# Patient Record
Sex: Female | Born: 1965 | Race: Black or African American | Hispanic: No | Marital: Single | Smoking: Never smoker
Health system: Southern US, Community
[De-identification: ages and names within clinical notes are randomized; demographics above are authoritative.]

## PROBLEM LIST (undated history)

## (undated) HISTORY — PX: ABDOMINAL HYSTERECTOMY: SHX81

---

## 1998-08-06 ENCOUNTER — Encounter: Admission: RE | Admit: 1998-08-06 | Discharge: 1998-08-06 | Payer: Self-pay | Admitting: Hematology and Oncology

## 2008-05-16 ENCOUNTER — Ambulatory Visit: Payer: Self-pay | Admitting: Oncology

## 2008-06-03 LAB — CBC WITH DIFFERENTIAL/PLATELET
BASO%: 0.2 % (ref 0.0–2.0)
EOS%: 1.7 % (ref 0.0–7.0)
HCT: 30.3 % — ABNORMAL LOW (ref 34.8–46.6)
MCH: 23 pg — ABNORMAL LOW (ref 26.0–34.0)
MCHC: 31.3 g/dL — ABNORMAL LOW (ref 32.0–36.0)
MONO#: 0.5 10*3/uL (ref 0.1–0.9)
NEUT%: 41.6 % (ref 39.6–76.8)
RBC: 4.11 10*6/uL (ref 3.70–5.32)
RDW: 27.8 % — ABNORMAL HIGH (ref 11.3–14.5)
WBC: 3.8 10*3/uL — ABNORMAL LOW (ref 3.9–10.0)
lymph#: 1.7 10*3/uL (ref 0.9–3.3)

## 2008-06-05 LAB — COMPREHENSIVE METABOLIC PANEL
ALT: <8 U/L (ref 0–35)
AST: 13 U/L (ref 0–37)
CO2: 24 mEq/L (ref 19–32)
Calcium: 9.1 mg/dL (ref 8.4–10.5)
Chloride: 103 mEq/L (ref 96–112)
Creatinine, Ser: 0.69 mg/dL (ref 0.40–1.20)
Sodium: 137 mEq/L (ref 135–145)
Total Protein: 7.5 g/dL (ref 6.0–8.3)

## 2008-06-05 LAB — FERRITIN: Ferritin: 8 ng/mL — ABNORMAL LOW (ref 10–291)

## 2008-06-05 LAB — HEMOGLOBINOPATHY EVALUATION
Hgb A2 Quant: 1.7 % — ABNORMAL LOW (ref 2.2–3.2)
Hgb F Quant: 0 % (ref 0.0–2.0)
Hgb S Quant: 0 % (ref 0.0–0.0)

## 2008-06-05 LAB — IRON AND TIBC
%SAT: 6 % — ABNORMAL LOW (ref 20–55)
TIBC: 380 ug/dL (ref 250–470)

## 2008-09-19 ENCOUNTER — Ambulatory Visit: Payer: Self-pay | Admitting: Oncology

## 2009-02-24 ENCOUNTER — Emergency Department (HOSPITAL_COMMUNITY): Admission: EM | Admit: 2009-02-24 | Discharge: 2009-02-24 | Payer: Self-pay | Admitting: Emergency Medicine

## 2010-07-16 ENCOUNTER — Encounter
Admission: RE | Admit: 2010-07-16 | Discharge: 2010-07-16 | Payer: Self-pay | Source: Home / Self Care | Attending: Obstetrics and Gynecology | Admitting: Obstetrics and Gynecology

## 2010-07-18 ENCOUNTER — Encounter: Payer: Self-pay | Admitting: Obstetrics and Gynecology

## 2010-08-04 ENCOUNTER — Other Ambulatory Visit: Payer: Self-pay | Admitting: Obstetrics and Gynecology

## 2010-08-04 DIAGNOSIS — Z1231 Encounter for screening mammogram for malignant neoplasm of breast: Secondary | ICD-10-CM

## 2010-08-18 ENCOUNTER — Ambulatory Visit
Admission: RE | Admit: 2010-08-18 | Discharge: 2010-08-18 | Disposition: A | Discharge status: Home / Self Care | Payer: BC Managed Care – PPO | Source: Ambulatory Visit | Attending: Obstetrics and Gynecology | Admitting: Obstetrics and Gynecology

## 2010-08-18 DIAGNOSIS — Z1231 Encounter for screening mammogram for malignant neoplasm of breast: Secondary | ICD-10-CM

## 2010-09-01 ENCOUNTER — Encounter (HOSPITAL_COMMUNITY): Payer: BC Managed Care – PPO | Attending: Obstetrics and Gynecology

## 2010-09-01 DIAGNOSIS — D649 Anemia, unspecified: Secondary | ICD-10-CM | POA: Insufficient documentation

## 2010-09-03 ENCOUNTER — Encounter (HOSPITAL_COMMUNITY)
Admission: RE | Admit: 2010-09-03 | Discharge: 2010-09-03 | Disposition: A | Discharge status: Home / Self Care | Payer: BC Managed Care – PPO | Source: Ambulatory Visit | Attending: Obstetrics and Gynecology | Admitting: Obstetrics and Gynecology

## 2010-09-03 ENCOUNTER — Other Ambulatory Visit (HOSPITAL_COMMUNITY): Payer: BC Managed Care – PPO

## 2010-09-03 LAB — CBC
Hemoglobin: 9.6 g/dL — ABNORMAL LOW (ref 12.0–15.0)
RBC: 3.45 MIL/uL — ABNORMAL LOW (ref 3.87–5.11)
WBC: 4.4 10*3/uL (ref 4.0–10.5)

## 2010-09-10 ENCOUNTER — Other Ambulatory Visit: Payer: Self-pay | Admitting: Obstetrics and Gynecology

## 2010-09-10 ENCOUNTER — Inpatient Hospital Stay (HOSPITAL_COMMUNITY)
Admission: RE | Admit: 2010-09-10 | Discharge: 2010-09-12 | DRG: 359 | Disposition: A | Discharge status: Home / Self Care | Payer: BC Managed Care – PPO | Source: Ambulatory Visit | Attending: Obstetrics and Gynecology | Admitting: Obstetrics and Gynecology

## 2010-09-10 DIAGNOSIS — N920 Excessive and frequent menstruation with regular cycle: Principal | ICD-10-CM | POA: Diagnosis present

## 2010-09-10 DIAGNOSIS — Z01812 Encounter for preprocedural laboratory examination: Secondary | ICD-10-CM

## 2010-09-10 DIAGNOSIS — D509 Iron deficiency anemia, unspecified: Secondary | ICD-10-CM | POA: Diagnosis present

## 2010-09-10 DIAGNOSIS — Z01818 Encounter for other preprocedural examination: Secondary | ICD-10-CM

## 2010-09-10 DIAGNOSIS — D259 Leiomyoma of uterus, unspecified: Secondary | ICD-10-CM | POA: Diagnosis present

## 2010-09-10 LAB — ABO/RH: ABO/RH(D): O POS

## 2010-09-11 LAB — BASIC METABOLIC PANEL
CO2: 31 mEq/L (ref 19–32)
Calcium: 8.9 mg/dL (ref 8.4–10.5)
Chloride: 102 mEq/L (ref 96–112)
Glucose, Bld: 132 mg/dL — ABNORMAL HIGH (ref 70–99)
Sodium: 136 mEq/L (ref 135–145)

## 2010-09-11 LAB — CBC
HCT: 31.5 % — ABNORMAL LOW (ref 36.0–46.0)
Hemoglobin: 9.8 g/dL — ABNORMAL LOW (ref 12.0–15.0)
MCH: 28.3 pg (ref 26.0–34.0)
MCHC: 31.1 g/dL (ref 30.0–36.0)
RBC: 3.46 MIL/uL — ABNORMAL LOW (ref 3.87–5.11)

## 2010-09-15 ENCOUNTER — Other Ambulatory Visit (HOSPITAL_COMMUNITY): Payer: BC Managed Care – PPO

## 2010-09-22 NOTE — Discharge Summary (Signed)
  [name], [place]              ACCOUNT NO.:  [id number]  MEDICAL RECORD NO.:  [id number]           PATIENT TYPE:  I  LOCATION:  9318                          [facility]ILITY:  [facility]  PHYSICIAN:  Maxie Better, M.D.DATE OF BIRTH:  1966-04-20  DATE OF ADMISSION:  09/10/2010 DATE OF DISCHARGE:  09/12/2010                              DISCHARGE SUMMARY   ADMISSION DIAGNOSES:  Menorrhagia, uterine fibroids, iron-deficiency anemia.  DISCHARGE DIAGNOSES:  Menorrhagia, uterine fibroids, iron-deficiency anemia.  PROCEDURES:  Exploratory laparotomy, total abdominal hysterectomy.  HISTORY OF PRESENT ILLNESS:  This is a 45 year old G 0, single black female with symptomatic uterine fibroids who opted for definitive surgical management.  HOSPITAL COURSE:  The patient was admitted to [facility].  She was taken to the operating room where she underwent an exploratory lap, TAH. The findings were that of a fibroid uterus which weighed in the operating room 1310 grams, normal ovaries bilaterally, normal tubes, normal upper abdomen.  Appendix was not seen.  This patient had a unremarkable postoperative course.  Her CBC on postop day #1, showed a hemoglobin of 9.8, hematocrit of 31.5, white count of 15.8, platelet count of 197,000.  Her incision had no erythema, induration, or exudate. The patient had minimal need for pain.  She subsequently passed flatus on postop day #2.  She was tolerating a regular diet from postop day #1, and at that point she was deemed well to be discharged home.  DISPOSITION:  Home.  CONDITION:  Stable.  DISCHARGE MEDICATIONS:  Integra one p.o. daily, Tylox 1-2 every 3-4 hours p.r.n. pain.  Motrin 800 mg one p.o. q.6 h p.r.n. pain.  Followup appointment at [facility] on Tuesday or Wednesday for staple removal and 4-6 weeks postop.  Discharge instructions was reviewed with the patient include no heavy lifting for 2 weeks.  No driving for 2 weeks, nothing  per vagina for 4-6 weeks.  Call for severe abdominal pain, nausea, vomiting, increased incisional pain, drainage or redness from the incision site.  No straining with the bowel movements, no tampon, douches, or  intercourse, call if soaking a regular pad every hour more frequently.     Maxie Better, M.D.     [place]/MEDQ  D:  09/12/2010  T:  09/13/2010  Job:  [id number]  Electronically Signed by Nena Jordan Hadyn Blanck M.D. on 09/22/2010 05:09:34 AM

## 2010-09-22 NOTE — Op Note (Signed)
[name], [name]              ACCOUNT NO.:  [id number]  MEDICAL RECORD NO.:  [id number]           PATIENT TYPE:  I  LOCATION:  9318                          [facility]  [facility]  PHYSICIAN:  Maxie Better, M.D.DATE OF BIRTH:  December 15, 1965  DATE OF PROCEDURE:  09/10/2010 DATE OF DISCHARGE:                              OPERATIVE REPORT   PREOPERATIVE DIAGNOSES:  Menorrhagia, iron-deficiency anemia, uterine fibroids.  POSTOPERATIVE DIAGNOSES:  Menorrhagia, iron-deficiency anemia, uterine fibroids.  PROCEDURE:  Exploratory laparotomy, total abdominal hysterectomy.  ANESTHESIA:  General.  SURGEON:  Maxie Better, MD  ASSISTANT:  Lenoard Aden, MD  PROCEDURE:  Under adequate general anesthesia, the patient was placed in a supine position.  She was sterilely prepped and draped in the usual fashion.  Indwelling Foley catheter was sterilely placed.  The patient received IV antibiotics for prophylaxis.  Marcaine 0.25% was injected along the planned [name] skin incision site.  [name] skin incision was then made carried down to the rectus fascia.  Rectus fascia was opened transversely.  Rectus fascia was then bluntly and sharply dissected off the rectus muscle in superior inferior fashion.  The rectus muscle was split in the midline.  The parietal peritoneum was entered sharply and extended.  The upper abdomen was then explored. Normal liver edge was palpable, normal palpable kidney, fibroid uterus extending to be equal about  16 weeks' uterus, irregular in shape was noted.  The uterus was exteriorized, shortened bilateral utero ovarian ligaments was noted, small ovaries were noted bilaterally.  Normal tubes were noted bilaterally.  The procedure was started by bilaterally suture ligating the round ligaments and severing at the midline and severing it from its attachment.  The vesicouterine peritoneum was then opened transversely and the bladder was then sharply  dissected off the lower uterine segment.  The posterior leaf of the broad ligaments were bilaterally opened.  Due to the shortened utero-ovarian ligaments, the LigaSure was then used to serially clamped, cauterized, and then ultimately cut with subsequent removal of the tubes and ovary from the uterine attachment.  Once that was none, the uterine vessels were bilaterally skeletonized, doubly clamped, cut and suture ligated with 0 Vicryl x2.  The cardinal ligament was then bilaterally clamped, cut, and suture ligated as well.  Subsequently the fundus was truncated from its cervical attachment to facilitate the surgery.  The cervical stump was then grasped.  The cardinal ligaments were then continued to be serially bilaterally clamped, cut, and on the left there was a pumping vessel noticed, thought to be secondary to the retraction of a vessel in one of the pedicles and after grasping that area and suture ligating again with 0 Vicryl, hemostasis was then achieved.  The procedure was then continued down to the cervicovaginal junction was reached bilaterally at that point, the cervicovaginal junction was then bilaterally clamped and the cervix was severed from its vaginal attachment.  The vaginal cuff was then closed with were bilaterally clamped over running lock stitch running stitch for midline to the fascia angle and then oversewn with a running lock stitch to the lateral aspect on both sides and the middle piece  was then closed with interrupted 0 Vicryl sutures.  Bleeding in the posterior aspect of the vaginal cuff was oversewn with 3-0 Vicryl suture so that the areas of bleeding on the cuff resulted in 0 Vicryl mattress stitches being placed with good hemostasis noted.  The ovarian pedicles were noted. Inspecting the one on the left, particularly resulted and the need for 3-0 Vicryl suture placement, despite the fact that the LigaSure had been used to be recauterized that area.  There  was no pedicle length left to suspend the ovaries to the round ligament in either side.  The ureters were noted to be deep in the pelvis.  The abdomen was copiously irrigated and suctioned debris and good hemostasis was noted.  The packings noted in the abdomen was removed.  The appendix was not seen.  The parietal peritoneum was closed with 2-0 Vicryl.  The rectus fascia was closed with 0 Vicryl x2.  The subcutaneous areas irrigated, small bleeders cauterized and interrupted 2-0 plain sutures were placed.  The skin was approximated with Ethicon staples.  Specimen was uterus and cervix measured 1310 grams sent to Pathology.  ESTIMATED BLOOD LOSS:  150 mL.  INTRAOPERATIVE FLUID:  2200 mL.  URINE OUTPUT:  700 mL clear yellow urine.  Sponge and instrument counts x2 was correct.  Complications none.  The patient tolerated procedure well and was transferred to recovery room in stable condition.     Maxie Better, M.D.     [place]/MEDQ  D:  09/10/2010  T:  09/11/2010  Job:  [id number]  Electronically Signed by Nena Jordan Letonia Stead M.D. on 09/22/2010 05:12:28 AM

## 2010-10-02 LAB — URINALYSIS, ROUTINE W REFLEX MICROSCOPIC
Glucose, UA: NEGATIVE mg/dL
Hgb urine dipstick: NEGATIVE
Protein, ur: NEGATIVE mg/dL
pH: 7 (ref 5.0–8.0)

## 2010-10-02 LAB — URINE MICROSCOPIC-ADD ON

## 2010-11-13 NOTE — H&P (Signed)
  [name], [name]              ACCOUNT NO.:  [id number]  MEDICAL RECORD NO.:  [id number]           PATIENT TYPE:  I  LOCATION:  9318                          [facility]  [facility]  PHYSICIAN:  Maxie Better, M.D.DATE OF BIRTH:  05/07/1966  DATE OF ADMISSION:  09/10/2010 DATE OF DISCHARGE:  09/12/2010                             HISTORY & PHYSICAL   HISTORY OF PRESENT ILLNESS:  This is a 45 year old gravida 0, single black female with symptomatic uterine fibroids here for removal of her fibroids.  The patient had been complaining of prolonged vaginal bleeding.  She was evaluated by ultrasound and endometrial biopsies. MRI showed multiple uterine fibroids.  The patient desired to preserve her fertility.  The patient subsequently decided to proceed with a total abdominal hysterectomy after discussing with her family.  ALLERGIES:  No known drug allergies.  MEDICATIONS:  Valtrex, vitamin B12, iron supplementation, and once a day vitamin.  PAST MEDICAL HISTORY: 1. Pernicious anemia. 2. Herpes. 3. Iron-deficiency anemia. 4. Excessive micturition. 5. Fibroid uterus.  FAMILY HISTORY:  Diabetes in mother, hypertension in mother.  Negative for breast, colon, and ovarian cancer.  SOCIAL HISTORY:  The patient is a nonsmoker, single.  No caffeine use, seat-belt use.  REVIEW OF SYSTEMS:  Negative except for the history of present illness.  PHYSICAL EXAMINATION:  GENERAL:  Well-developed, well-nourished black female in no acute distress. SKIN:  No lesions. VITAL SIGNS:  Blood pressure 110/68, weight 195 pounds, and height 5 feet 6-1/2 inches. LUNGS:  Clear to auscultation. HEART:  Regular rate and rhythm without murmur. BREASTS: Soft and nontender.  No palpable mass. ABDOMEN:  Normal.  No palpable hernias.  Abdominal mass with a periumbilical mass consistent with fibroid uterus.  Liver and spleen was normal. PELVIC:  No lesions.  Vagina had some bloody discharge.  Cervix  was normal.  The uterus was 18 weeks irregular, anteverted.  Adnexa nonpalpable. EXTREMITIES:  No edema. LYMPH NODES:  No palpable axillary, supraclavicular, or inguinal nodes palpable.  IMPRESSION: 1. Fibroid uterus. 2. Excessive micturition. 3. Anemia.  PLAN:  Exploratory laparotomy and total abdominal hysterectomy.  Risk of the procedure was reviewed with the patient including but not limited to infection, bleeding, possible blood transfusion, preservation of her ovaries, internal scar tissue, bowel obstruction in the future, fistula formation, postop care, and criteria for discharge reviewed.     Maxie Better, M.D.     [place]/MEDQ  D:  11/08/2010  T:  11/08/2010  Job:  [id number]  Electronically Signed by Nena Jordan Diego Ulbricht M.D. on 11/13/2010 06:42:42 PM

## 2011-09-21 ENCOUNTER — Other Ambulatory Visit: Payer: Self-pay | Admitting: Obstetrics and Gynecology

## 2011-09-21 DIAGNOSIS — Z1231 Encounter for screening mammogram for malignant neoplasm of breast: Secondary | ICD-10-CM

## 2011-10-04 ENCOUNTER — Ambulatory Visit
Admission: RE | Admit: 2011-10-04 | Discharge: 2011-10-04 | Disposition: A | Discharge status: Home / Self Care | Payer: BC Managed Care – PPO | Source: Ambulatory Visit | Attending: Obstetrics and Gynecology | Admitting: Obstetrics and Gynecology

## 2011-10-04 DIAGNOSIS — Z1231 Encounter for screening mammogram for malignant neoplasm of breast: Secondary | ICD-10-CM

## 2012-09-13 ENCOUNTER — Other Ambulatory Visit: Payer: Self-pay

## 2012-09-13 DIAGNOSIS — Z1231 Encounter for screening mammogram for malignant neoplasm of breast: Secondary | ICD-10-CM

## 2012-10-11 ENCOUNTER — Ambulatory Visit
Admission: RE | Admit: 2012-10-11 | Discharge: 2012-10-11 | Disposition: A | Discharge status: Home / Self Care | Payer: BC Managed Care – PPO | Source: Ambulatory Visit

## 2012-10-11 DIAGNOSIS — Z1231 Encounter for screening mammogram for malignant neoplasm of breast: Secondary | ICD-10-CM

## 2013-10-01 ENCOUNTER — Other Ambulatory Visit: Payer: Self-pay

## 2013-10-01 DIAGNOSIS — Z1231 Encounter for screening mammogram for malignant neoplasm of breast: Secondary | ICD-10-CM

## 2013-10-15 ENCOUNTER — Ambulatory Visit
Admission: RE | Admit: 2013-10-15 | Discharge: 2013-10-15 | Disposition: A | Discharge status: Home / Self Care | Payer: Self-pay | Source: Ambulatory Visit

## 2013-10-15 ENCOUNTER — Encounter (INDEPENDENT_AMBULATORY_CARE_PROVIDER_SITE_OTHER): Payer: Self-pay

## 2013-10-15 DIAGNOSIS — Z1231 Encounter for screening mammogram for malignant neoplasm of breast: Secondary | ICD-10-CM

## 2014-02-27 ENCOUNTER — Emergency Department (HOSPITAL_COMMUNITY)
Admission: EM | Admit: 2014-02-27 | Discharge: 2014-02-28 | Disposition: A | Discharge status: Home / Self Care | Payer: BC Managed Care – PPO | Attending: Emergency Medicine | Admitting: Emergency Medicine

## 2014-02-27 ENCOUNTER — Encounter (HOSPITAL_COMMUNITY): Payer: Self-pay | Admitting: Emergency Medicine

## 2014-02-27 DIAGNOSIS — W268XXA Contact with other sharp object(s), not elsewhere classified, initial encounter: Secondary | ICD-10-CM | POA: Insufficient documentation

## 2014-02-27 DIAGNOSIS — IMO0002 Reserved for concepts with insufficient information to code with codable children: Secondary | ICD-10-CM | POA: Insufficient documentation

## 2014-02-27 DIAGNOSIS — Z862 Personal history of diseases of the blood and blood-forming organs and certain disorders involving the immune mechanism: Secondary | ICD-10-CM | POA: Diagnosis not present

## 2014-02-27 DIAGNOSIS — Y9289 Other specified places as the place of occurrence of the external cause: Secondary | ICD-10-CM | POA: Insufficient documentation

## 2014-02-27 DIAGNOSIS — S68119A Complete traumatic metacarpophalangeal amputation of unspecified finger, initial encounter: Secondary | ICD-10-CM

## 2014-02-27 DIAGNOSIS — Y9389 Activity, other specified: Secondary | ICD-10-CM | POA: Diagnosis not present

## 2014-02-27 DIAGNOSIS — S61209A Unspecified open wound of unspecified finger without damage to nail, initial encounter: Secondary | ICD-10-CM | POA: Diagnosis present

## 2014-02-27 DIAGNOSIS — S68129A Partial traumatic metacarpophalangeal amputation of unspecified finger, initial encounter: Secondary | ICD-10-CM

## 2014-02-27 NOTE — ED Notes (Signed)
Pt was slicing vegetables and sliced her rt middle finger with the vegetable slicer; pt sliced off end of rt middle finger; bleeding upon arrival; clean dressing and pressure applied to area

## 2014-02-27 NOTE — ED Provider Notes (Signed)
CSN: [id number]     Arrival date & time 02/27/14  2220 History   None   This chart was scribed for non-physician practitioner, Hazel Sams, PA, working with Virgel Manifold, MD by Terressa Koyanagi, ED Scribe. This patient was seen in room [place] and the patient's care was started at 11:47 PM.  Chief Complaint  Patient presents with  . Extremity Laceration   The history is provided by the patient. No language interpreter was used.   HPI Comments: [name] is a 48 y.o. female, with a Hx of anemia, who presents to the Emergency Department complaining of a amputation of her right, middle fingertip sustained tonight while slicing cucumbers with a mandoline. Pt also complains of associated pain to said area and describes pain as a throbbing pain and rates it a 5 out of 10. Pt reports she is UTD with her tetanus shot.   History reviewed. No pertinent past medical history. Past Surgical History  Procedure Laterality Date  . Abdominal hysterectomy     No family history on file. History  Substance Use Topics  . Smoking status: Never Smoker   . Smokeless tobacco: Not on file  . Alcohol Use: Yes     Comment: socially   OB History   Grav Para Term Preterm Abortions TAB SAB Ect Mult Living                 Review of Systems  Constitutional: Negative for fever and chills.  Skin: Positive for wound (amputation of tip of right, middle finger ).  Psychiatric/Behavioral: Negative for confusion.  All other systems reviewed and are negative.     Allergies  Review of patient's allergies indicates no known allergies.  Home Medications   Prior to Admission medications   Not on File   Triage Vitals: BP 106/61  Pulse 65  Temp(Src) 98 F (36.7 C) (Oral)  Resp 16  SpO2 99%  LMP 08/08/2010 Physical Exam  Nursing note and vitals reviewed. Constitutional: She is oriented to person, place, and time. She appears well-developed and well-nourished. No distress.  HENT:  Head:  Normocephalic and atraumatic.  Eyes: Conjunctivae and EOM are normal.  Neck: Neck supple. No tracheal deviation present.  Cardiovascular: Normal rate.   Pulmonary/Chest: Effort normal. No respiratory distress.  Musculoskeletal: Normal range of motion.  Small amputation to the very tip of the right third finger. Bleeding controlled at this time. No damage to the nail.  Neurological: She is alert and oriented to person, place, and time.  Skin: Skin is warm and dry.  Psychiatric: She has a normal mood and affect. Her behavior is normal.      ED Course  Procedures   DIAGNOSTIC STUDIES: Oxygen Saturation is 99% on RA, nl by my interpretation.    COORDINATION OF CARE: 11:49 PM-Discussed treatment plan which includes cleaning the wound and dressing the area with pt at bedside. Patient verbalizes understanding and agrees with treatment plan.  11:52 PM: The wound is cleansed, debrided of foreign material as much as possible, and dressed. The patient is alerted to watch for any signs of infection (redness, pus, pain, increased swelling or fever) and call if such occurs. Home wound care instructions are provided. Tetanus up-to-date.   MDM   Final diagnoses:  Fingertip amputation, initial encounter    I personally performed the services described in this documentation, which was scribed in my presence. The recorded information has been reviewed and is accurate.    Martie Lee, PA-C  02/28/14 0011 

## 2014-02-28 NOTE — ED Provider Notes (Signed)
Medical screening examination/treatment/procedure(s) were performed by non-physician practitioner and as supervising physician I was immediately available for consultation/collaboration.   EKG Interpretation None       Virgel Manifold, MD 02/28/14 [phone]

## 2014-02-28 NOTE — Discharge Instructions (Signed)
Keep wound clean and dry. Followup with her primary care provider or return for any changing or worsening symptoms.   Fingertip Injuries and Amputations Fingertip injuries are common and often get injured because they are last to escape when pulling your hand out of harm's way. You have amputated (cut off) part of your finger. How this turns out depends largely on how much was amputated. If just the tip is amputated, often the end of the finger will grow back and the finger may return to much the same as it was before the injury.  If more of the finger is missing, your caregiver has done the best with the tissue remaining to allow you to keep as much finger as is possible. Your caregiver after checking your injury has tried to leave you with a painless fingertip that has durable, feeling skin. If possible, your caregiver has tried to maintain the finger's length and appearance and preserve its fingernail.  Please read the instructions outlined below and refer to this sheet in the next few weeks. These instructions provide you with general information on caring for yourself. Your caregiver may also give you specific instructions. While your treatment has been done according to the most current medical practices available, unavoidable complications occasionally occur. If you have any problems or questions after discharge, please call your caregiver. HOME CARE INSTRUCTIONS   You may resume normal diet and activities as directed or allowed.  Keep your hand elevated above the level of your heart. This helps decrease pain and swelling.  Keep ice packs (or a bag of ice wrapped in a towel) on the injured area for 15-20 minutes, 03-04 times per day, for the first two days.  Change dressings if necessary or as directed.  Clean the wound daily or as directed.  Only take over-the-counter or prescription medicines for pain, discomfort, or fever as directed by your caregiver.  Keep appointments as  directed. SEEK IMMEDIATE MEDICAL CARE IF:  You develop redness, swelling, numbness or increasing pain in the wound.  There is pus coming from the wound.  You develop an unexplained oral temperature above 102 F (38.9 C) or as your caregiver suggests.  There is a foul (bad) smell coming from the wound or dressing.  There is a breaking open of the wound (edges not staying together) after sutures or staples have been removed. MAKE SURE YOU:   Understand these instructions.  Will watch your condition.  Will get help right away if you are not doing well or get worse. Document Released: 05/04/2005 Document Revised: 09/05/2011 Document Reviewed: 04/02/2008 [facility] Patient Information 2015 [place], [place]. This information is not intended to replace advice given to you by your health care provider. Make sure you discuss any questions you have with your health care provider.

## 2014-11-06 ENCOUNTER — Other Ambulatory Visit: Payer: Self-pay

## 2014-11-06 DIAGNOSIS — Z1231 Encounter for screening mammogram for malignant neoplasm of breast: Secondary | ICD-10-CM

## 2014-11-11 ENCOUNTER — Ambulatory Visit
Admission: RE | Admit: 2014-11-11 | Discharge: 2014-11-11 | Disposition: A | Discharge status: Home / Self Care | Payer: 59 | Source: Ambulatory Visit

## 2014-11-11 DIAGNOSIS — Z1231 Encounter for screening mammogram for malignant neoplasm of breast: Secondary | ICD-10-CM

## 2015-12-31 ENCOUNTER — Other Ambulatory Visit: Payer: Self-pay | Admitting: Obstetrics and Gynecology

## 2015-12-31 DIAGNOSIS — Z1231 Encounter for screening mammogram for malignant neoplasm of breast: Secondary | ICD-10-CM

## 2016-01-05 ENCOUNTER — Ambulatory Visit: Payer: 59

## 2016-01-14 ENCOUNTER — Ambulatory Visit
Admission: RE | Admit: 2016-01-14 | Discharge: 2016-01-14 | Disposition: A | Discharge status: Home / Self Care | Payer: 59 | Source: Ambulatory Visit | Attending: Obstetrics and Gynecology | Admitting: Obstetrics and Gynecology

## 2016-01-14 DIAGNOSIS — Z1231 Encounter for screening mammogram for malignant neoplasm of breast: Secondary | ICD-10-CM

## 2017-03-28 ENCOUNTER — Other Ambulatory Visit: Payer: Self-pay | Admitting: Obstetrics and Gynecology

## 2017-03-28 DIAGNOSIS — Z1231 Encounter for screening mammogram for malignant neoplasm of breast: Secondary | ICD-10-CM

## 2017-03-31 ENCOUNTER — Ambulatory Visit: Payer: 59

## 2017-05-15 ENCOUNTER — Other Ambulatory Visit: Payer: Self-pay

## 2017-05-15 ENCOUNTER — Encounter: Payer: Self-pay | Admitting: *Deleted

## 2017-05-15 ENCOUNTER — Emergency Department (INDEPENDENT_AMBULATORY_CARE_PROVIDER_SITE_OTHER)
Admission: EM | Admit: 2017-05-15 | Discharge: 2017-05-15 | Disposition: A | Discharge status: Home / Self Care | Payer: PRIVATE HEALTH INSURANCE | Source: Home / Self Care | Attending: Family Medicine | Admitting: Family Medicine

## 2017-05-15 DIAGNOSIS — R21 Rash and other nonspecific skin eruption: Secondary | ICD-10-CM

## 2017-05-15 LAB — POCT CBC W AUTO DIFF (K'VILLE URGENT CARE)

## 2017-05-15 NOTE — Discharge Instructions (Signed)
Take prednisone 10mg  tabs as follows: 2 tabs twice daily for 4 days, then 2 tabs once daily for 2 days.

## 2017-05-15 NOTE — ED Notes (Signed)
Blood drawn from LT antecubital area. Attempted 1 stick. Pt tolerated procedure well. Charna Archer, LPN

## 2017-05-15 NOTE — ED Triage Notes (Signed)
Pt c/o rash on her legs x 1 day. Denies itching.

## 2017-05-15 NOTE — ED Provider Notes (Signed)
[name] [facility]    CSN: [id number] Arrival date & time: 05/15/17  1851     History   Chief Complaint Chief Complaint  Patient presents with  . Rash    HPI [name] is a 51 y.o. female.   Patient complains of awakening with a rash on her lower legs yesterday.  She denies any contact with allergens, plants, etc.  No recent medication changes or new foods.  She feels well.   The history is provided by the patient.  Rash  Location: lower legs. Quality: dryness, redness and swelling   Quality: not blistering, not bruising, not burning, not draining, not itchy, not painful, not peeling, not scaling and not weeping   Severity:  Mild Onset quality:  Sudden Duration:  1 day Timing:  Constant Progression:  Unchanged Chronicity:  New Context: not animal contact, not chemical exposure, not exposure to similar rash, not food, not hot tub use, not insect bite/sting, not medications, not new detergent/soap, not nuts, not plant contact and not sick contacts   Relieved by:  None tried Worsened by:  Nothing Associated symptoms: no abdominal pain, no diarrhea, no fatigue, no fever, no headaches, no induration, no joint pain, no myalgias, no nausea, no shortness of breath, no sore throat, no URI and not wheezing     History reviewed. No pertinent past medical history.  There are no active problems to display for this patient.   Past Surgical History:  Procedure Laterality Date  . ABDOMINAL HYSTERECTOMY      OB History    No data available       Home Medications    Prior to Admission medications   Not on File    Family History Family History  Problem Relation Age of Onset  . Diabetes Mother   . Hypertension Mother   . Diabetes Brother     Social History Social History   Tobacco Use  . Smoking status: Never Smoker  . Smokeless tobacco: Never Used  Substance Use Topics  . Alcohol use: Yes    Comment: socially  . Drug use: No     Allergies     Patient has no known allergies.   Review of Systems Review of Systems  Constitutional: Negative for fatigue and fever.  HENT: Negative for sore throat.   Respiratory: Negative for shortness of breath and wheezing.   Gastrointestinal: Negative for abdominal pain, diarrhea and nausea.  Musculoskeletal: Negative for arthralgias and myalgias.  Skin: Positive for rash.  Neurological: Negative for headaches.  All other systems reviewed and are negative.    Physical Exam Triage Vital Signs ED Triage Vitals  Enc Vitals Group     BP 05/15/17 1957 138/87     Pulse Rate 05/15/17 1957 70     Resp 05/15/17 1957 16     Temp 05/15/17 1957 98.4 F (36.9 C)     Temp Source 05/15/17 1957 Oral     SpO2 05/15/17 1957 97 %     Weight 05/15/17 1957 200 lb (90.7 kg)     Height 05/15/17 1957 5\' 8"  (1.727 m)     Head Circumference --      Peak Flow --      Pain Score 05/15/17 1958 0     Pain Loc --      Pain Edu? --      Excl. in [place]? --    No data found.  Updated Vital Signs BP 138/87 (BP Location: Left Arm)  Pulse 70   Temp 98.4 F (36.9 C) (Oral)   Resp 16   Ht 5\' 8"  (1.727 m)   Wt 200 lb (90.7 kg)   LMP 08/08/2010   SpO2 97%   BMI 30.41 kg/m   Visual Acuity Right Eye Distance:   Left Eye Distance:   Bilateral Distance:    Right Eye Near:   Left Eye Near:    Bilateral Near:     Physical Exam  Constitutional: She appears well-developed and well-nourished. No distress.  HENT:  Head: Normocephalic.  Right Ear: External ear normal.  Left Ear: External ear normal.  Nose: Nose normal.  Mouth/Throat: Oropharynx is clear and moist.  Eyes: Conjunctivae are normal. Pupils are equal, round, and reactive to light.  Cardiovascular: Normal heart sounds.  Pulmonary/Chest: Breath sounds normal.  Abdominal: There is no tenderness.  Musculoskeletal: She exhibits no edema.       Legs: There are erythematous, slightly tender, irregular, immobile, slightly raised nodules on both  pretibial areas, more prominent on the right.  Lesions are generally 1 to 1.5cm diameter.  Lymphadenopathy:    She has no cervical adenopathy.  Neurological: She is alert.  Skin: Skin is warm and dry. Rash noted.  Nursing note and vitals reviewed.    UC Treatments / Results  Labs (all labs ordered are listed, but only abnormal results are displayed) Labs Reviewed  POCT CBC W AUTO DIFF ([place]):  WBC 8.3; LY 47.4; MO 3.6; GR 49.0; Hgb 12.4; Platelets 190     EKG  EKG Interpretation None       Radiology No results found.  Procedures Procedures (including critical care time)  Medications Ordered in UC Medications - No data to display   Initial Impression / Assessment and Plan / UC Course  I have reviewed the triage vital signs and the nursing notes.  Pertinent labs & imaging results that were available during my care of the patient were reviewed by me and considered in my medical decision making (see chart for details).    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  Will begin prednisone burst/taper. Take prednisone 10mg  tabs as follows (patient has 10mg  tabs at home): 2 tabs twice daily for 4 days, then 2 tabs once daily for 2 days. Followup with dermatologist if not improved two weeks.   Final Clinical Impressions(s) / UC Diagnoses   Final diagnoses:  Rash and nonspecific skin eruption    ED Discharge Orders    None           Kandra Nicolas, MD 05/18/17 1008

## 2017-05-31 ENCOUNTER — Ambulatory Visit
Admission: RE | Admit: 2017-05-31 | Discharge: 2017-05-31 | Disposition: A | Discharge status: Home / Self Care | Payer: PRIVATE HEALTH INSURANCE | Source: Ambulatory Visit | Attending: Obstetrics and Gynecology | Admitting: Obstetrics and Gynecology

## 2017-05-31 DIAGNOSIS — Z1231 Encounter for screening mammogram for malignant neoplasm of breast: Secondary | ICD-10-CM

## 2017-08-03 ENCOUNTER — Encounter: Payer: Self-pay | Admitting: *Deleted

## 2017-08-03 ENCOUNTER — Other Ambulatory Visit: Payer: Self-pay

## 2017-08-03 ENCOUNTER — Emergency Department (INDEPENDENT_AMBULATORY_CARE_PROVIDER_SITE_OTHER)
Admission: EM | Admit: 2017-08-03 | Discharge: 2017-08-03 | Disposition: A | Discharge status: Home / Self Care | Payer: PRIVATE HEALTH INSURANCE | Source: Home / Self Care | Attending: Family Medicine | Admitting: Family Medicine

## 2017-08-03 DIAGNOSIS — B9789 Other viral agents as the cause of diseases classified elsewhere: Secondary | ICD-10-CM

## 2017-08-03 DIAGNOSIS — J069 Acute upper respiratory infection, unspecified: Secondary | ICD-10-CM | POA: Diagnosis not present

## 2017-08-03 MED ORDER — BENZONATATE 200 MG PO CAPS: ORAL_CAPSULE | ORAL | 0 refills | Status: DC

## 2017-08-03 MED ORDER — AZITHROMYCIN 250 MG PO TABS: ORAL_TABLET | ORAL | 0 refills | Status: DC

## 2017-08-03 NOTE — ED Provider Notes (Signed)
[name]    CSN: [id number] Arrival date & time: 08/03/17  1125     History   Chief Complaint Chief Complaint  Patient presents with  . Cough  . Nasal Congestion  . Night Sweats    HPI [name] is a 52 y.o. female.   Eight days ago patient developed typical cold-like symptoms developing over several days, including mild sore throat, sinus congestion, fatigue, and cough.  He has had sweats during the past 3 days. He has a history of seasonal rhinitis (springtime).   The history is provided by the patient.    History reviewed. No pertinent past medical history.  There are no active problems to display for this patient.   Past Surgical History:  Procedure Laterality Date  . ABDOMINAL HYSTERECTOMY      OB History    No data available       Home Medications    Prior to Admission medications   Medication Sig Start Date End Date Taking? Authorizing Provider  Cyanocobalamin (B-12) 1000 MCG/ML KIT Inject as directed.   Yes [provider]  azithromycin (ZITHROMAX Z-PAK) 250 MG tablet Take 2 tabs today; then begin one tab once daily for 4 more days. (Rx void after 08/11/17) 08/03/17   Kandra Nicolas, MD  benzonatate (TESSALON) 200 MG capsule Take one cap by mouth at bedtime as needed for cough.  May repeat in 4 to 6 hours 08/03/17   Kandra Nicolas, MD    Family History Family History  Problem Relation Age of Onset  . Diabetes Mother   . Hypertension Mother   . Diabetes Brother     Social History Social History   Tobacco Use  . Smoking status: Never Smoker  . Smokeless tobacco: Never Used  Substance Use Topics  . Alcohol use: Yes    Comment: socially  . Drug use: No     Allergies   Patient has no known allergies.   Review of Systems Review of Systems + sore throat + cough No pleuritic pain No wheezing + nasal congestion + post-nasal drainage No sinus pain/pressure No itchy/red eyes No earache No hemoptysis No  SOB No fever, + chills/sweats No nausea No vomiting No abdominal pain No diarrhea No urinary symptoms No skin rash + fatigue No myalgias No headache Used OTC meds without relief   Physical Exam Triage Vital Signs ED Triage Vitals [08/03/17 1141]  Enc Vitals Group     BP 122/77     Pulse Rate 78     Resp 14     Temp 98.2 F (36.8 C)     Temp Source Oral     SpO2 97 %     Weight 198 lb (89.8 kg)     Height      Head Circumference      Peak Flow      Pain Score 0     Pain Loc      Pain Edu?      Excl. in [place]?    No data found.  Updated Vital Signs BP 122/77 (BP Location: Right Arm)   Pulse 78   Temp 98.2 F (36.8 C) (Oral)   Resp 14   Wt 198 lb (89.8 kg)   LMP 08/08/2010   SpO2 97%   BMI 30.11 kg/m   Visual Acuity Right Eye Distance:   Left Eye Distance:   Bilateral Distance:    Right Eye Near:   Left Eye Near:  Bilateral Near:     Physical Exam Nursing notes and Vital Signs reviewed. Appearance:  Patient appears stated age, and in no acute distress Eyes:  Pupils are equal, round, and reactive to light and accomodation.  Extraocular movement is intact.  Conjunctivae are not inflamed  Ears:  Canals normal.  Tympanic membranes normal.  Nose:  Mildly congested turbinates.  No sinus tenderness. Pharynx:  Normal Neck:  Supple.  Enlarged posterior/lateral nodes are palpated bilaterally, tender to palpation on the left.   Lungs:  Clear to auscultation.  Breath sounds are equal.  Moving air well. Heart:  Regular rate and rhythm without murmurs, rubs, or gallops.  Abdomen:  Nontender without masses or hepatosplenomegaly.  Bowel sounds are present.  No CVA or flank tenderness.  Extremities:  No edema.  Skin:  No rash present.    UC Treatments / Results  Labs (all labs ordered are listed, but only abnormal results are displayed) Labs Reviewed - No data to display  EKG  EKG Interpretation None       Radiology No results  found.  Procedures Procedures (including critical care time)  Medications Ordered in UC Medications - No data to display   Initial Impression / Assessment and Plan / UC Course  I have reviewed the triage vital signs and the nursing notes.  Pertinent labs & imaging results that were available during my care of the patient were reviewed by me and considered in my medical decision making (see chart for details).    There is no evidence of bacterial infection today.  Treat symptomatically for now  Prescription written for Benzonatate (Tessalon) to take at bedtime for night-time cough.  Take plain guaifenesin (1282m extended release tabs such as Mucinex) twice daily, with plenty of water, for cough and congestion.  May add Pseudoephedrine (342m one or two every 4 to 6 hours) for sinus congestion.  Get adequate rest.   May use Afrin nasal spray (or generic oxymetazoline) each morning for about 5 days and then discontinue.  Also recommend using saline nasal spray several times daily and saline nasal irrigation (AYR is a common brand).  Use Flonase nasal spray each morning after using Afrin nasal spray and saline nasal irrigation. Try warm salt water gargles for sore throat.  Stop all antihistamines for now, and other non-prescription cough/cold preparations. May take Ibuprofen 20089m4 tabs every 8 hours with food for body aches, headache, etc. Begin Azithromycin if not improving about one week or if persistent fever develops (Given a prescription to hold, with an expiration date)  Followup with Family Doctor if not improved in about 10 days.    Final Clinical Impressions(s) / UC Diagnoses   Final diagnoses:  Viral URI with cough    ED Discharge Orders        Ordered    benzonatate (TESSALON) 200 MG capsule     08/03/17 1215    azithromycin (ZITHROMAX Z-PAK) 250 MG tablet     08/03/17 1216          BeeKandra NicolasD 08/06/17 1420

## 2017-08-03 NOTE — Discharge Instructions (Signed)
Take plain guaifenesin (1200mg  extended release tabs such as Mucinex) twice daily, with plenty of water, for cough and congestion.  May add Pseudoephedrine (30mg , one or two every 4 to 6 hours) for sinus congestion.  Get adequate rest.   May use Afrin nasal spray (or generic oxymetazoline) each morning for about 5 days and then discontinue.  Also recommend using saline nasal spray several times daily and saline nasal irrigation (AYR is a common brand).  Use Flonase nasal spray each morning after using Afrin nasal spray and saline nasal irrigation. Try warm salt water gargles for sore throat.  Stop all antihistamines for now, and other non-prescription cough/cold preparations. May take Ibuprofen 200mg , 4 tabs every 8 hours with food for body aches, headache, etc. Begin Azithromycin if not improving about one week or if persistent fever develops

## 2018-11-20 ENCOUNTER — Other Ambulatory Visit: Payer: Self-pay | Admitting: Obstetrics and Gynecology

## 2018-11-20 DIAGNOSIS — Z1231 Encounter for screening mammogram for malignant neoplasm of breast: Secondary | ICD-10-CM

## 2019-01-08 ENCOUNTER — Ambulatory Visit
Admission: RE | Admit: 2019-01-08 | Discharge: 2019-01-08 | Disposition: A | Discharge status: Home / Self Care | Payer: PRIVATE HEALTH INSURANCE | Source: Ambulatory Visit | Attending: Obstetrics and Gynecology | Admitting: Obstetrics and Gynecology

## 2019-01-08 DIAGNOSIS — Z1231 Encounter for screening mammogram for malignant neoplasm of breast: Secondary | ICD-10-CM

## 2020-03-09 ENCOUNTER — Emergency Department (INDEPENDENT_AMBULATORY_CARE_PROVIDER_SITE_OTHER)
Admission: EM | Admit: 2020-03-09 | Discharge: 2020-03-09 | Disposition: A | Discharge status: Home / Self Care | Payer: Commercial Managed Care - PPO | Source: Home / Self Care

## 2020-03-09 ENCOUNTER — Other Ambulatory Visit: Payer: Self-pay

## 2020-03-09 DIAGNOSIS — M545 Low back pain, unspecified: Secondary | ICD-10-CM

## 2020-03-09 MED ORDER — METHOCARBAMOL 500 MG PO TABS: 500.0000 mg | ORAL_TABLET | Freq: Two times a day (BID) | ORAL | 0 refills | Status: DC

## 2020-03-09 MED ORDER — MELOXICAM 15 MG PO TABS: 15.0000 mg | ORAL_TABLET | Freq: Every day | ORAL | 0 refills | Status: DC
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
## 2020-03-09 NOTE — Discharge Instructions (Signed)
  Meloxicam (Mobic) is an antiinflammatory to help with pain and inflammation.  Do not take ibuprofen, Advil, Aleve, or any other medications that contain NSAIDs while taking meloxicam as this may cause stomach upset or even ulcers if taken in large amounts for an extended period of time.   Robaxin (methocarbamol) is a muscle relaxer and may cause drowsiness. Do not drink alcohol, drive, or operate heavy machinery while taking.  Call to schedule a follow up appointment with Sports Medicine in 1-2 weeks if not improving, sooner if worsening.

## 2021-08-17 ENCOUNTER — Other Ambulatory Visit: Payer: Self-pay

## 2021-08-17 ENCOUNTER — Emergency Department (INDEPENDENT_AMBULATORY_CARE_PROVIDER_SITE_OTHER): Payer: Commercial Managed Care - PPO

## 2021-08-17 ENCOUNTER — Emergency Department (INDEPENDENT_AMBULATORY_CARE_PROVIDER_SITE_OTHER)
Admission: RE | Admit: 2021-08-17 | Discharge: 2021-08-17 | Disposition: A | Discharge status: Home / Self Care | Payer: Commercial Managed Care - PPO | Source: Ambulatory Visit

## 2021-08-17 VITALS — BP 124/83 | HR 73 | Temp 98.3°F | Resp 18

## 2021-08-17 DIAGNOSIS — R0789 Other chest pain: Secondary | ICD-10-CM | POA: Diagnosis not present

## 2021-08-17 DIAGNOSIS — M546 Pain in thoracic spine: Secondary | ICD-10-CM

## 2021-08-17 MED ORDER — NAPROXEN 500 MG PO TABS: 500.0000 mg | ORAL_TABLET | Freq: Two times a day (BID) | ORAL | 0 refills | Status: AC

## 2021-08-17 MED ORDER — TIZANIDINE HCL 4 MG PO TABS: 4.0000 mg | ORAL_TABLET | Freq: Three times a day (TID) | ORAL | 0 refills | Status: AC | PRN
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
## 2021-08-17 NOTE — Discharge Instructions (Signed)
Your x-rays were normal.  Start Naprosyn twice daily.  Do not take NSAIDs including aspirin, ibuprofen/Advil, naproxen/Aleve with this medication as it can cause stomach bleeding.  You can use Tylenol for breakthrough pain.  Use Zanaflex (tizanidine) up to 3 times a day.  This will make you sleepy so do not drive or drink alcohol with taking it.  Use heat and gentle stretch for symptom relief.  I do recommend light activity as tolerated.  If your symptoms are not improving you should follow-up with your primary care provider to consider referral to physical therapy or additional imaging.  If you have any severe symptoms or if anything worsens you should be seen immediately.
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
